# Patient Record
Sex: Male | Born: 1985 | Race: Black or African American | Hispanic: No | Smoking: Current every day smoker
Health system: Southern US, Community
[De-identification: ages and names within clinical notes are randomized; demographics above are authoritative.]

## PROBLEM LIST (undated history)

## (undated) HISTORY — PX: NO PAST SURGERIES: SHX2092

## (undated) HISTORY — PX: OTHER SURGICAL HISTORY: SHX169

---
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
## 2005-07-10 ENCOUNTER — Emergency Department (HOSPITAL_COMMUNITY): Admission: EM | Admit: 2005-07-10 | Discharge: 2005-07-10 | Payer: Self-pay | Admitting: Emergency Medicine

## 2007-09-27 ENCOUNTER — Emergency Department (HOSPITAL_COMMUNITY): Admission: EM | Admit: 2007-09-27 | Discharge: 2007-09-27 | Payer: Self-pay | Admitting: Emergency Medicine

## 2009-04-07 ENCOUNTER — Inpatient Hospital Stay (HOSPITAL_COMMUNITY): Admission: EM | Admit: 2009-04-07 | Discharge: 2009-04-09 | Payer: Self-pay | Admitting: Emergency Medicine

## 2010-06-21 LAB — POCT I-STAT, CHEM 8
BUN: 10 mg/dL (ref 6–23)
Chloride: 104 mEq/L (ref 96–112)
HCT: 49 % (ref 39.0–52.0)
Potassium: 3.8 mEq/L (ref 3.5–5.1)
TCO2: 28 mmol/L (ref 0–100)

## 2010-06-21 LAB — BASIC METABOLIC PANEL
Calcium: 8.8 mg/dL (ref 8.4–10.5)
GFR calc Af Amer: >60 mL/min (ref >60)
GFR calc non Af Amer: >60 mL/min (ref >60)
Potassium: 3.7 mEq/L (ref 3.5–5.1)
Sodium: 137 mEq/L (ref 135–145)

## 2010-06-21 LAB — CBC
HCT: 43.7 % (ref 39.0–52.0)
MCHC: 34.2 g/dL (ref 30.0–36.0)
RBC: 4.65 MIL/uL (ref 4.22–5.81)
WBC: 11 10*3/uL — ABNORMAL HIGH (ref 4.0–10.5)
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
## 2013-06-28 ENCOUNTER — Encounter (HOSPITAL_COMMUNITY): Payer: Self-pay | Admitting: Emergency Medicine

## 2013-06-28 ENCOUNTER — Emergency Department (HOSPITAL_COMMUNITY)
Admission: EM | Admit: 2013-06-28 | Discharge: 2013-06-28 | Disposition: A | Discharge status: Home / Self Care | Payer: Self-pay | Attending: Emergency Medicine | Admitting: Emergency Medicine

## 2013-06-28 DIAGNOSIS — M62838 Other muscle spasm: Secondary | ICD-10-CM | POA: Insufficient documentation

## 2013-06-28 DIAGNOSIS — F17200 Nicotine dependence, unspecified, uncomplicated: Secondary | ICD-10-CM | POA: Insufficient documentation

## 2013-06-28 MED ORDER — CYCLOBENZAPRINE HCL 10 MG PO TABS: 10.0000 mg | ORAL_TABLET | Freq: Two times a day (BID) | ORAL | Status: DC | PRN

## 2013-06-28 MED ORDER — IBUPROFEN 400 MG PO TABS
600.0000 mg | ORAL_TABLET | Freq: Once | ORAL | Status: AC
  Administered 2013-06-28: 600 mg via ORAL
  Filled 2013-06-28 (×2): qty 1

## 2013-06-28 MED ORDER — IBUPROFEN 600 MG PO TABS: 600.0000 mg | ORAL_TABLET | Freq: Four times a day (QID) | ORAL | Status: DC | PRN

## 2013-06-28 MED ORDER — TRAMADOL HCL 50 MG PO TABS: 50.0000 mg | ORAL_TABLET | Freq: Four times a day (QID) | ORAL | Status: DC | PRN

## 2013-06-28 MED ORDER — DIAZEPAM 5 MG PO TABS
5.0000 mg | ORAL_TABLET | Freq: Once | ORAL | Status: AC
  Administered 2013-06-28: 5 mg via ORAL
  Filled 2013-06-28: qty 1

## 2013-06-28 MED ORDER — HYDROCODONE-ACETAMINOPHEN 5-325 MG PO TABS
2.0000 | ORAL_TABLET | Freq: Once | ORAL | Status: AC
  Administered 2013-06-28: 2 via ORAL
  Filled 2013-06-28: qty 2

## 2013-06-28 NOTE — ED Notes (Signed)
PA at bedside.

## 2013-06-28 NOTE — ED Notes (Signed)
Pt reporting neck and back pain since Monday; tried icy/hot. Cannot touch chin to chest due to pain. Denies fever and chills.

## 2013-06-28 NOTE — Discharge Planning (Signed)
[facility] Felicia E, [employer]  Spoke to patient about primary care resources and establishing care with a provider. Patient states he is employed but  his employer doesn't offer health insurance. Resource guide and [employer] Market place information was provided. Patient was also provided with my contact information for any future questions or concerns.

## 2013-06-28 NOTE — ED Provider Notes (Signed)
CSN: [id number]     Arrival date & time 06/28/13  0940 History   None   This chart was scribed for Junius Finner PA-C, a non-physician practitioner working with No att. providers found by Lewanda Rife, ED Scribe. This patient was seen in room TR07C/TR07C and the patient's care was started at 5:09 PM      Chief Complaint  Patient presents with  . Back Pain     (Consider location/radiation/quality/duration/timing/severity/associated sxs/prior Treatment) The history is provided by the patient. No language interpreter was used.   HPI Comments: Shannon Stewart is a 28 y.o. male who presents to the Emergency Department complaining of constant worsening neck stiffness and pain onset 4 days. Describes pain as 7/10 in severity and "uncomfortable". Reports trying Icy hot, and tylenol with no relief of symptoms. Reports pain is exacerbated with movement to the left. Denies associated headache, nausea, emesis, change in vision, recent travel, sick contacts, rash, back pain, and recent trauma/injury. Denies significant PMHx.   History reviewed. No pertinent past medical history. History reviewed. No pertinent past surgical history. History reviewed. No pertinent family history. History  Substance Use Topics  . Smoking status: Current Every Day Smoker -- 0.50 packs/day  . Smokeless tobacco: Not on file  . Alcohol Use: Not on file    Review of Systems  Constitutional: Negative for fever.  Eyes: Negative for visual disturbance.  Gastrointestinal: Negative for nausea and vomiting.  Musculoskeletal: Positive for neck pain and neck stiffness. Negative for back pain.  Psychiatric/Behavioral: Negative for confusion.      Allergies  Review of patient's allergies indicates no known allergies.  Home Medications   Current Outpatient Rx  Name  Route  Sig  Dispense  Refill  . cyclobenzaprine (FLEXERIL) 10 MG tablet   Oral   Take 1 tablet (10 mg total) by mouth 2 (two) times daily as needed  for muscle spasms.   20 tablet   0   . ibuprofen (ADVIL,MOTRIN) 600 MG tablet   Oral   Take 1 tablet (600 mg total) by mouth every 6 (six) hours as needed.   30 tablet   0   . traMADol (ULTRAM) 50 MG tablet   Oral   Take 1 tablet (50 mg total) by mouth every 6 (six) hours as needed.   15 tablet   0    BP 113/67  Pulse 58  Temp(Src) 98.4 F (36.9 C) (Oral)  Resp 18  Ht 5\' 11"  (1.803 m)  Wt 165 lb (74.844 kg)  BMI 23.02 kg/m2  SpO2 100% Physical Exam  Nursing note and vitals reviewed. Constitutional: He is oriented to person, place, and time. He appears well-developed and well-nourished. No distress.  HENT:  Head: Normocephalic and atraumatic.  Eyes: EOM are normal.  Neck: Neck supple. Muscular tenderness present. No spinous process tenderness present. No tracheal deviation present.    Muscle spasm noted to bilateral cervical paraspinal muscles   Cardiovascular: Normal rate.   Pulmonary/Chest: Effort normal. No respiratory distress.  Musculoskeletal: Normal range of motion.  No midline C-spine, T-spine, or L-spine tenderness with no step-offs or deformities noted    Neurological: He is alert and oriented to person, place, and time.  Skin: Skin is warm and dry.  Psychiatric: He has a normal mood and affect. His behavior is normal.    ED Course  Procedures  COORDINATION OF CARE:  Nursing notes reviewed. Vital signs reviewed. Initial pt interview and examination performed.   5:09 PM-Discussed treatment with pt  at bedside. Pt agrees with plan.   Treatment plan initiated: Medications  diazepam (VALIUM) tablet 5 mg (5 mg Oral Given 06/28/13 1002)  ibuprofen (ADVIL,MOTRIN) tablet 600 mg (600 mg Oral Given 06/28/13 1002)  HYDROcodone-acetaminophen (NORCO/VICODIN) 5-325 MG per tablet 2 tablet (2 tablets Oral Given 06/28/13 1113)     Initial diagnostic testing ordered.    Labs Review Labs Reviewed - No data to display Imaging Review No results found.   EKG  Interpretation None      MDM   Final diagnoses:  Muscle spasms of neck    Pt presenting with neck pain and stiffness that started 3 days ago after waking up. Denies trauma but does report playing basketball day before. Denies recent illness including fever, n/v/d. Denies headache.  On exam pt appears well, non-toxic, NAD. Vitals-unremarkable, afebrile. Pt is tender along cervical musculature. Doubt meningitis.  Will tx pain in ED. Heating pack, valium and Ibuprofen given.  10:56 AM Pt has improved some, mild increased ROM in neck since given heating pack, valium and ibuprofen.  Will give Norco prior to discharge. Will discharge home and have f/u with PCP for continued pain.  Discussed continued heat therapy and gentle stretches.  Return precautions provided. Pt verbalized understanding and agreement with tx plan.  I personally performed the services described in this documentation, which was scribed in my presence. The recorded information has been reviewed and is accurate.    Junius Finnerrin O'Malley, PA-C 06/28/13 1712

## 2013-06-28 NOTE — ED Notes (Signed)
Nuchal stiffness; lacks full ROM.

## 2013-06-28 NOTE — Discharge Instructions (Signed)
Muscle Cramps and Spasms °Muscle cramps and spasms occur when a muscle or muscles tighten and you have no control over this tightening (involuntary muscle contraction). They are a common problem and can develop in any muscle. The most common place is in the calf muscles of the leg. Both muscle cramps and muscle spasms are involuntary muscle contractions, but they also have differences:  °· Muscle cramps are sporadic and painful. They may last a few seconds to a quarter of an hour. Muscle cramps are often more forceful and last longer than muscle spasms. °· Muscle spasms may or may not be painful. They may also last just a few seconds or much longer. °CAUSES  °It is uncommon for cramps or spasms to be due to a serious underlying problem. In many cases, the cause of cramps or spasms is unknown. Some common causes are:  °· Overexertion.   °· Overuse from repetitive motions (doing the same thing over and over).   °· Remaining in a certain position for a long period of time.   °· Improper preparation, form, or technique while performing a sport or activity.   °· Dehydration.   °· Injury.   °· Side effects of some medicines.   °· Abnormally low levels of the salts and ions in your blood (electrolytes), especially potassium and calcium. This could happen if you are taking water pills (diuretics) or you are pregnant.   °Some underlying medical problems can make it more likely to develop cramps or spasms. These include, but are not limited to:  °· Diabetes.   °· Parkinson disease.   °· Hormone disorders, such as thyroid problems.   °· Alcohol abuse.   °· Diseases specific to muscles, joints, and bones.   °· Blood vessel disease where not enough blood is getting to the muscles.   °HOME CARE INSTRUCTIONS  °· Stay well hydrated. Drink enough water and fluids to keep your urine clear or pale yellow. °· It may be helpful to massage, stretch, and relax the affected muscle. °· For tight or tense muscles, use a warm towel, heating  pad, or hot shower water directed to the affected area. °· If you are sore or have pain after a cramp or spasm, applying ice to the affected area may relieve discomfort. °· Put ice in a plastic bag. °· Place a towel between your skin and the bag. °· Leave the ice on for 15-20 minutes, 03-04 times a day. °· Medicines used to treat a known cause of cramps or spasms may help reduce their frequency or severity. Only take over-the-counter or prescription medicines as directed by your caregiver. °SEEK MEDICAL CARE IF:  °Your cramps or spasms get more severe, more frequent, or do not improve over time.  °MAKE SURE YOU:  °· Understand these instructions. °· Will watch your condition. °· Will get help right away if you are not doing well or get worse. °Document Released: 09/11/2001 Document Revised: 07/17/2012 Document Reviewed: 03/08/2012 °ExitCare® Patient Information ©2014 [employer]. ° °Heat Therapy °Heat therapy can help make painful, stiff muscles and joints feel better. Do not use heat on new injuries. Wait at least 48 hours after an injury to use heat. Do not use heat when you have aches or pains right after an activity. If you still have pain 3 hours after stopping the activity, then you may use heat. °HOME CARE °Wet heat pack °· Soak a clean towel in warm water. Squeeze out the extra water. °· Put the warm, wet towel in a plastic bag. °· Place a thin, dry towel between your   skin and the bag. °· Put the heat pack on the area for 5 minutes, and check your skin. Your skin may be pink, but it should not be red. °· Leave the heat pack on the area for 15 to 30 minutes. °· Repeat this every 2 to 4 hours while awake. Do not use heat while you are sleeping. °Warm water bath °· Fill a tub with warm water. °· Place the affected body part in the tub. °· Soak the area for 20 to 40 minutes. °· Repeat as needed. °Hot water bottle °· Fill the water bottle half full with hot water. °· Press out the extra air. Close the cap  tightly. °· Place a dry towel between your skin and the bottle. °· Put the bottle on the area for 5 minutes, and check your skin. Your skin may be pink, but it should not be red. °· Leave the bottle on the area for 15 to 30 minutes. °· Repeat this every 2 to 4 hours while awake. °Electric heating pad °· Place a dry towel between your skin and the heating pad. °· Set the heating pad on low heat. °· Put the heating pad on the area for 10 minutes, and check your skin. Your skin may be pink, but it should not be red. °· Leave the heating pad on the area for 20 to 40 minutes. °· Repeat this every 2 to 4 hours while awake. °· Do not lie on the heating pad. °· Do not fall asleep while using the heating pad. °· Do not use the heating pad near water. °GET HELP RIGHT AWAY IF: °· You get blisters or red skin. °· Your skin is puffy (swollen), or you lose feeling (numbness) in the affected area. °· You have any new problems. °· Your problems are getting worse. °· You have any questions or concerns. °If you have any problems, stop using heat therapy until you see your doctor. °MAKE SURE YOU: °· Understand these instructions. °· Will watch your condition. °· Will get help right away if you are not doing well or get worse. °Document Released: 06/14/2011 Document Reviewed: 06/14/2011 °ExitCare® Patient Information ©2014 [employer]. ° °

## 2013-07-01 NOTE — ED Provider Notes (Signed)
Medical screening examination/treatment/procedure(s) were performed by non-physician practitioner and as supervising physician I was immediately available for consultation/collaboration.   EKG Interpretation None       Donnetta HutchingBrian Kristalyn Bergstresser, MD 07/01/13 1531

## 2014-12-04 ENCOUNTER — Emergency Department (HOSPITAL_COMMUNITY)
Admission: EM | Admit: 2014-12-04 | Discharge: 2014-12-04 | Disposition: A | Discharge status: Home / Self Care | Payer: Self-pay | Attending: Emergency Medicine | Admitting: Emergency Medicine

## 2014-12-04 ENCOUNTER — Encounter (HOSPITAL_COMMUNITY): Payer: Self-pay | Admitting: *Deleted

## 2014-12-04 DIAGNOSIS — L03115 Cellulitis of right lower limb: Secondary | ICD-10-CM | POA: Insufficient documentation

## 2014-12-04 DIAGNOSIS — Z720 Tobacco use: Secondary | ICD-10-CM | POA: Insufficient documentation

## 2014-12-04 LAB — URINALYSIS, ROUTINE W REFLEX MICROSCOPIC
GLUCOSE, UA: NEGATIVE mg/dL
KETONES UR: NEGATIVE mg/dL
Leukocytes, UA: NEGATIVE
Nitrite: NEGATIVE
PH: 5.5 (ref 5.0–8.0)
Protein, ur: NEGATIVE mg/dL
SPECIFIC GRAVITY, URINE: 1.03 (ref 1.005–1.030)
Urobilinogen, UA: 0.2 mg/dL (ref 0.0–1.0)

## 2014-12-04 LAB — CBC WITH DIFFERENTIAL/PLATELET
BASOS ABS: 0.1 10*3/uL (ref 0.0–0.1)
BASOS PCT: 1 % (ref 0–1)
EOS PCT: 2 % (ref 0–5)
Eosinophils Absolute: 0.2 10*3/uL (ref 0.0–0.7)
HEMATOCRIT: 44.3 % (ref 39.0–52.0)
Hemoglobin: 15 g/dL (ref 13.0–17.0)
LYMPHS PCT: 20 % (ref 12–46)
Lymphs Abs: 2 10*3/uL (ref 0.7–4.0)
MCH: 32.7 pg (ref 26.0–34.0)
MCHC: 33.9 g/dL (ref 30.0–36.0)
MCV: 96.5 fL (ref 78.0–100.0)
MONO ABS: 0.9 10*3/uL (ref 0.1–1.0)
MONOS PCT: 10 % (ref 3–12)
NEUTROS ABS: 6.8 10*3/uL (ref 1.7–7.7)
Neutrophils Relative %: 69 % (ref 43–77)
PLATELETS: 323 10*3/uL (ref 150–400)
RBC: 4.59 MIL/uL (ref 4.22–5.81)
RDW: 13.1 % (ref 11.5–15.5)
WBC: 9.9 10*3/uL (ref 4.0–10.5)

## 2014-12-04 LAB — URINE MICROSCOPIC-ADD ON

## 2014-12-04 LAB — BASIC METABOLIC PANEL
ANION GAP: 7 (ref 5–15)
BUN: 7 mg/dL (ref 6–20)
CALCIUM: 9.2 mg/dL (ref 8.9–10.3)
CO2: 28 mmol/L (ref 22–32)
CREATININE: 1.11 mg/dL (ref 0.61–1.24)
Chloride: 103 mmol/L (ref 101–111)
GFR calc Af Amer: >60 mL/min (ref >60)
GLUCOSE: 94 mg/dL (ref 65–99)
Potassium: 3.8 mmol/L (ref 3.5–5.1)
Sodium: 138 mmol/L (ref 135–145)

## 2014-12-04 MED ORDER — OXYCODONE-ACETAMINOPHEN 5-325 MG PO TABS
1.0000 | ORAL_TABLET | Freq: Once | ORAL | Status: AC
  Administered 2014-12-04: 1 via ORAL

## 2014-12-04 MED ORDER — CLINDAMYCIN HCL 150 MG PO CAPS: 300.0000 mg | ORAL_CAPSULE | Freq: Three times a day (TID) | ORAL | Status: DC

## 2014-12-04 MED ORDER — ACIDOPHILUS PROBIOTIC 10 MG PO TABS: 10.0000 mg | ORAL_TABLET | Freq: Three times a day (TID) | ORAL | Status: DC

## 2014-12-04 MED ORDER — NAPROXEN 250 MG PO TABS: 250.0000 mg | ORAL_TABLET | Freq: Two times a day (BID) | ORAL | Status: DC

## 2014-12-04 MED ORDER — CLINDAMYCIN HCL 150 MG PO CAPS
300.0000 mg | ORAL_CAPSULE | Freq: Once | ORAL | Status: AC
  Administered 2014-12-04: 300 mg via ORAL
  Filled 2014-12-04: qty 2

## 2014-12-04 MED ORDER — OXYCODONE-ACETAMINOPHEN 5-325 MG PO TABS
ORAL_TABLET | ORAL | Status: AC
  Filled 2014-12-04: qty 1

## 2014-12-04 NOTE — ED Notes (Signed)
Patient presents with right lower leg swelling  Noticed an area to the bottom of his knee and now swelling 3/4 way down his calf   ?spider bite

## 2014-12-04 NOTE — Discharge Instructions (Signed)

## 2014-12-04 NOTE — ED Provider Notes (Signed)
CSN: [id number]     Arrival date & time 12/04/14  1922 History  This chart was scribed for Everlene Farrier, PA-C, working with Gerhard Munch, MD by Octavia Heir, ED Scribe. This patient was seen in room TR10C/TR10C and the patient's care was started at 8:08 PM.    Chief Complaint  Patient presents with  . Leg Pain      The history is provided by the patient. No language interpreter was used.   HPI Comments: Shannon Stewart is a 29 y.o. male who presents to the Emergency Department complaining of constant, gradual worsening right leg pain onset 3 days ago. He states having associated nausea today. Pt reports waking up to a bump on his right knee with some pain when with movement. He has had progressive swelling down his right calf and states there is pain when he bends his knee.  Pt denies fevers, chills, muscle spasms, leg cramping, neck pain, neck stiffness, or rash.  History reviewed. No pertinent past medical history. Past Surgical History  Procedure Laterality Date  . Other surgical history     No family history on file. Social History  Substance Use Topics  . Smoking status: Current Every Day Smoker -- 0.50 packs/day  . Smokeless tobacco: Never Used  . Alcohol Use: Yes    Review of Systems  Constitutional: Negative for fever and chills.  Eyes: Negative for visual disturbance.  Genitourinary: Negative for dysuria, urgency, frequency, hematuria and difficulty urinating.  Musculoskeletal: Positive for joint swelling and arthralgias. Negative for back pain, neck pain and neck stiffness.  Skin: Positive for color change. Negative for rash and wound.  Neurological: Negative for dizziness, weakness, light-headedness, numbness and headaches.      Allergies  Review of patient's allergies indicates no known allergies.  Home Medications   Prior to Admission medications   Medication Sig Start Date End Date Taking? Authorizing Provider  clindamycin (CLEOCIN) 150 MG capsule  Take 2 capsules (300 mg total) by mouth 3 (three) times daily. May dispense as 150mg  capsules 12/04/14   Everlene Farrier, PA-C  cyclobenzaprine (FLEXERIL) 10 MG tablet Take 1 tablet (10 mg total) by mouth 2 (two) times daily as needed for muscle spasms. 06/28/13   Junius Finner, PA-C  ibuprofen (ADVIL,MOTRIN) 600 MG tablet Take 1 tablet (600 mg total) by mouth every 6 (six) hours as needed. 06/28/13   Junius Finner, PA-C  Lactobacillus (ACIDOPHILUS PROBIOTIC) 10 MG TABS Take 10 mg by mouth 3 (three) times daily. 12/04/14   Everlene Farrier, PA-C  naproxen (NAPROSYN) 250 MG tablet Take 1 tablet (250 mg total) by mouth 2 (two) times daily with a meal. 12/04/14   Everlene Farrier, PA-C  traMADol (ULTRAM) 50 MG tablet Take 1 tablet (50 mg total) by mouth every 6 (six) hours as needed. 06/28/13   Junius Finner, PA-C   Triage vitals: BP 110/68 mmHg  Pulse 83  Temp(Src) 99.4 F (37.4 C) (Oral)  Resp 16  Ht 6' (1.829 m)  Wt 160 lb 9.6 oz (72.848 kg)  BMI 21.78 kg/m2  SpO2 98%  Physical Exam  Constitutional: He appears well-developed and well-nourished. No distress.  Nontoxic appearing.  HENT:  Head: Normocephalic and atraumatic.  Eyes: Right eye exhibits no discharge. Left eye exhibits no discharge.  Cardiovascular: Normal rate, regular rhythm and intact distal pulses.   Bilateral dorsalis pedis and posterior tibialis pulses are intact.  Pulmonary/Chest: Effort normal. No respiratory distress.  Abdominal: Soft. There is no tenderness.  Musculoskeletal: Normal range of  motion. He exhibits edema and tenderness.  tenderness to the anterior aspect of right knee with edema, some edema to right calf. Good ROM of right knee. Knee feels warm to touch and there is an area of erythema to the anterior aspect of his knee and leg that is marked with a skin marker. Bilateral DP/PT pulses intact. No pedal or ankle edema. No evidence of abscess on soft tissue ultrasound.    Neurological: He is alert. Coordination normal.   Sensation is intact to his bilateral lower extremities.   Skin: Skin is warm and dry. No rash noted. He is not diaphoretic. There is erythema. No pallor.  See MSK.   Psychiatric: He has a normal mood and affect. His behavior is normal.  Nursing note and vitals reviewed.   ED Course  Procedures  DIAGNOSTIC STUDIES: Oxygen Saturation is 98% on RA, normal by my interpretation.  COORDINATION OF CARE:  8:14 PM Discussed treatment plan with pt at bedside and pt agreed to plan.  Labs Review Labs Reviewed  URINALYSIS, ROUTINE W REFLEX MICROSCOPIC (NOT AT [facility]) - Abnormal; Notable for the following:    Hgb urine dipstick TRACE (*)    Bilirubin Urine SMALL (*)    All other components within normal limits  CBC WITH DIFFERENTIAL/PLATELET  BASIC METABOLIC PANEL  URINE MICROSCOPIC-ADD ON    Imaging Review No results found.  EMERGENCY DEPARTMENT US SOFT TISSUE INTERPRETATION "Study: Limited Ultrasound of the noted body part in comments below"  INDICATIONS: Soft tissue infection Multiple views of the body part are obtained with a multi-frequency linear probe  PERFORMED BY:  Myself  IMAGES ARCHIVED?: Yes  SIDE:Right   BODY PART:Lower extremity  FINDINGS: Cellulitis present, No abscess noted.   LIMITATIONS:  N/A  INTERPRETATION:  Cellulitis present  COMMENT:    Cellulitis without abscess.    EKG Interpretation None      Filed Vitals:   12/04/14 1941 12/04/14 2103 12/04/14 2104  BP: 110/68 119/67   Pulse: 83 67   Temp: 99.4 F (37.4 C)  99.2 F (37.3 C)  TempSrc: Oral  Axillary  Resp: 16 16   Height: 6' (1.829 m)    Weight: 160 lb 9.6 oz (72.848 kg)    SpO2: 98% 99%      MDM   Meds given in ED:  Medications  oxyCODONE-acetaminophen (PERCOCET/ROXICET) 5-325 MG per tablet (not administered)  oxyCODONE-acetaminophen (PERCOCET/ROXICET) 5-325 MG per tablet 1 tablet (1 tablet Oral Given 12/04/14 1954)  clindamycin (CLEOCIN) capsule 300 mg (300 mg Oral Given  12/04/14 2101)    Discharge Medication List as of 12/04/2014  8:55 PM    START taking these medications   Details  clindamycin (CLEOCIN) 150 MG capsule Take 2 capsules (300 mg total) by mouth 3 (three) times daily. May dispense as 150mg  capsules, Starting 12/04/2014, Until Discontinued, Print    Lactobacillus (ACIDOPHILUS PROBIOTIC) 10 MG TABS Take 10 mg by mouth 3 (three) times daily., Starting 12/04/2014, Until Discontinued, Print    naproxen (NAPROSYN) 250 MG tablet Take 1 tablet (250 mg total) by mouth 2 (two) times daily with a meal., Starting 12/04/2014, Until Discontinued, Print        Final diagnoses:  Cellulitis of right lower extremity    This is a 29 year old male who preent to the emergency department cmplaining of  Pain and swelling to his right lower extremity. He reports it first started with a bump to his right anterior knee  And progressed to pain and swelling down his  lower leg. On exam the patient is afebrile nontoxic appearing. The patient has tenderness to the anterior aspect of his knee. He has good range of motion of his right knee and is not concerning for septic joint. There is erythema to the anterior aspect of his knee and anterior right leg that has been marked with skin marker. His right leg feels warmer than his left over the erythema. Soft tissue ultrasound indicated no evidence of abscess. Patient's exam is consistent with cellulitis. We'll discharge patient with prescriptions for clindamycin and naproxen for pain control. I advised him he needs to follow-up in 48 hours to have his cellulitis rechecked. I advised the patient to follow-up with their primary care provider this week. I advised the patient to return to the emergency department with new or worsening symptoms or new concerns. The patient verbalized understanding and agreement with plan.    This patient was discussed with Dr. Jeraldine Loots who agrees with assessment and plan.  I personally performed the services  described in this documentation, which was scribed in my presence. The recorded information has been reviewed and is accurate.    Everlene Farrier, PA-C 12/04/14 2147  Gerhard Munch, MD 12/04/14 [phone]

## 2014-12-07 ENCOUNTER — Encounter (HOSPITAL_COMMUNITY): Payer: Self-pay | Admitting: Nurse Practitioner

## 2014-12-07 ENCOUNTER — Emergency Department (HOSPITAL_COMMUNITY)
Admission: EM | Admit: 2014-12-07 | Discharge: 2014-12-07 | Disposition: A | Discharge status: Home / Self Care | Payer: Self-pay | Attending: Emergency Medicine | Admitting: Emergency Medicine

## 2014-12-07 DIAGNOSIS — Z720 Tobacco use: Secondary | ICD-10-CM | POA: Insufficient documentation

## 2014-12-07 DIAGNOSIS — L0291 Cutaneous abscess, unspecified: Secondary | ICD-10-CM

## 2014-12-07 DIAGNOSIS — L02415 Cutaneous abscess of right lower limb: Secondary | ICD-10-CM | POA: Insufficient documentation

## 2014-12-07 DIAGNOSIS — L03115 Cellulitis of right lower limb: Secondary | ICD-10-CM | POA: Insufficient documentation

## 2014-12-07 MED ORDER — LIDOCAINE HCL (PF) 1 % IJ SOLN
5.0000 mL | Freq: Once | INTRAMUSCULAR | Status: AC
  Administered 2014-12-07: 5 mL

## 2014-12-07 MED ORDER — LIDOCAINE-EPINEPHRINE 2 %-1:200000 IJ SOLN
20.0000 mL | Freq: Once | INTRAMUSCULAR | Status: AC
Start: 2014-12-07 — End: 2014-12-07
  Administered 2014-12-07: 20 mL
  Filled 2014-12-07: qty 20

## 2014-12-07 MED ORDER — VANCOMYCIN HCL 10 G IV SOLR
1250.0000 mg | Freq: Once | INTRAVENOUS | Status: AC
  Administered 2014-12-07: 1250 mg via INTRAVENOUS
  Filled 2014-12-07: qty 1250

## 2014-12-07 MED ORDER — LIDOCAINE HCL (PF) 1 % IJ SOLN
INTRAMUSCULAR | Status: AC
  Administered 2014-12-07: 5 mL
  Filled 2014-12-07: qty 5

## 2014-12-07 MED ORDER — HYDROCODONE-ACETAMINOPHEN 5-325 MG PO TABS
2.0000 | ORAL_TABLET | Freq: Once | ORAL | Status: AC
  Administered 2014-12-07: 2 via ORAL
  Filled 2014-12-07: qty 2

## 2014-12-07 NOTE — ED Provider Notes (Addendum)
CSN: [id number]     Arrival date & time 12/07/14  1238 History   First MD Initiated Contact with Patient 12/07/14 1313     Chief Complaint  Patient presents with  . Skin Problem      HPI  Patient presents for evaluation of the lower leg infection.  Seen here several days ago and placed on clindamycin. Cellulitis. Head area became worsening and more painful with proximal aspect of the cellulitis. States he "squeezed it is was draining blood and pus". Presents here. No fever shakes chills rigors.  History reviewed. No pertinent past medical history. Past Surgical History  Procedure Laterality Date  . Other surgical history     History reviewed. No pertinent family history. Social History  Substance Use Topics  . Smoking status: Current Every Day Smoker -- 0.50 packs/day  . Smokeless tobacco: Never Used  . Alcohol Use: Yes    Review of Systems  Constitutional: Negative for fever, chills, diaphoresis, appetite change and fatigue.  HENT: Negative for mouth sores, sore throat and trouble swallowing.   Eyes: Negative for visual disturbance.  Respiratory: Negative for cough, chest tightness, shortness of breath and wheezing.   Cardiovascular: Negative for chest pain.  Gastrointestinal: Negative for nausea, vomiting, abdominal pain, diarrhea and abdominal distention.  Endocrine: Negative for polydipsia, polyphagia and polyuria.  Genitourinary: Negative for dysuria, frequency and hematuria.  Musculoskeletal: Negative for gait problem.  Skin: Negative for color change, pallor and rash.       Cellulitis and abscess right lower leg.  Neurological: Negative for dizziness, syncope, light-headedness and headaches.  Hematological: Does not bruise/bleed easily.  Psychiatric/Behavioral: Negative for behavioral problems and confusion.      Allergies  Review of patient's allergies indicates no known allergies.  Home Medications   Prior to Admission medications   Medication Sig Start  Date End Date Taking? Authorizing Provider  clindamycin (CLEOCIN) 150 MG capsule Take 2 capsules (300 mg total) by mouth 3 (three) times daily. May dispense as 150mg  capsules 12/04/14  Yes Everlene Farrier, PA-C  ibuprofen (ADVIL,MOTRIN) 200 MG tablet Take 200 mg by mouth every 6 (six) hours as needed for mild pain or moderate pain.   Yes Historical Provider, MD  Lactobacillus (ACIDOPHILUS PROBIOTIC) 10 MG TABS Take 10 mg by mouth 3 (three) times daily. Patient not taking: Reported on 12/07/2014 12/04/14   Everlene Farrier, PA-C  naproxen (NAPROSYN) 250 MG tablet Take 1 tablet (250 mg total) by mouth 2 (two) times daily with a meal. Patient not taking: Reported on 12/07/2014 12/04/14   Everlene Farrier, PA-C   BP 117/46 mmHg  Pulse 89  Temp(Src) 98.9 F (37.2 C) (Oral)  Resp 17  SpO2 96% Physical Exam  Constitutional: He is oriented to person, place, and time. He appears well-developed and well-nourished. No distress.  HENT:  Head: Normocephalic.  Eyes: Conjunctivae are normal. Pupils are equal, round, and reactive to light. No scleral icterus.  Neck: Normal range of motion. Neck supple. No thyromegaly present.  Cardiovascular: Normal rate and regular rhythm.  Exam reveals no gallop and no friction rub.   No murmur heard. Pulmonary/Chest: Effort normal and breath sounds normal. No respiratory distress. He has no wheezes. He has no rales.  Abdominal: Soft. Bowel sounds are normal. He exhibits no distension. There is no tenderness. There is no rebound.  Musculoskeletal: Normal range of motion.       Legs: Neurological: He is alert and oriented to person, place, and time.  Skin: Skin is warm and  dry. No rash noted.  Psychiatric: He has a normal mood and affect. His behavior is normal.    ED Course  Procedures (including critical care time) Labs Review Labs Reviewed - No data to display  Imaging Review No results found. I have personally reviewed and evaluated these images and lab results as  part of my medical decision-making.   EKG Interpretation None      MDM   Final diagnoses:  Abscess  Cellulitis of right lower extremity   After incision and drainage patient given IV vancomycin. Given sections on care of the abscess and packing removal. Recheck here if any failure to improve or worsening.   INCISION AND DRAINAGE Performed by: Claudean Kinds Consent: Verbal consent obtained. Risks and benefits: risks, benefits and alternatives were discussed Type: abscess  Body area: right knee  Anesthesia: local infiltration  Incision was made with a scalpel.  Local anesthetic: lidocaine 2% c epinephrine  Anesthetic total: 4 ml  Complexity: complex Blunt dissection to break up loculations  Drainage: purulent  Drainage amount: small  Packing material: 1/4 in iodoform gauze  Patient tolerance: Patient tolerated the procedure well with no immediate complications.   and     Rolland Porter, MD 12/08/14 8119  Rolland Porter, MD 12/20/14 [phone]

## 2014-12-07 NOTE — Discharge Instructions (Signed)
In 48 hours, soak and remove the gauze from the wound. Continue warm soaks 3 times per day with gentle massage. Return here with any worsening.  Abscess An abscess is an infected area that contains a collection of pus and debris.It can occur in almost any part of the body. An abscess is also known as a furuncle or boil. CAUSES  An abscess occurs when tissue gets infected. This can occur from blockage of oil or sweat glands, infection of hair follicles, or a minor injury to the skin. As the body tries to fight the infection, pus collects in the area and creates pressure under the skin. This pressure causes pain. People with weakened immune systems have difficulty fighting infections and get certain abscesses more often.  SYMPTOMS Usually an abscess develops on the skin and becomes a painful mass that is red, warm, and tender. If the abscess forms under the skin, you may feel a moveable soft area under the skin. Some abscesses break open (rupture) on their own, but most will continue to get worse without care. The infection can spread deeper into the body and eventually into the bloodstream, causing you to feel ill.  DIAGNOSIS  Your caregiver will take your medical history and perform a physical exam. A sample of fluid may also be taken from the abscess to determine what is causing your infection. TREATMENT  Your caregiver may prescribe antibiotic medicines to fight the infection. However, taking antibiotics alone usually does not cure an abscess. Your caregiver may need to make a small cut (incision) in the abscess to drain the pus. In some cases, gauze is packed into the abscess to reduce pain and to continue draining the area. HOME CARE INSTRUCTIONS   Only take over-the-counter or prescription medicines for pain, discomfort, or fever as directed by your caregiver.  If you were prescribed antibiotics, take them as directed. Finish them even if you start to feel better.  If gauze is used, follow  your caregiver's directions for changing the gauze.  To avoid spreading the infection:  Keep your draining abscess covered with a bandage.  Wash your hands well.  Do not share personal care items, towels, or whirlpools with others.  Avoid skin contact with others.  Keep your skin and clothes clean around the abscess.  Keep all follow-up appointments as directed by your caregiver. SEEK MEDICAL CARE IF:   You have increased pain, swelling, redness, fluid drainage, or bleeding.  You have muscle aches, chills, or a general ill feeling.  You have a fever. MAKE SURE YOU:   Understand these instructions.  Will watch your condition.  Will get help right away if you are not doing well or get worse. Document Released: 12/30/2004 Document Revised: 09/21/2011 Document Reviewed: 06/04/2011 [facility] Patient Information 2015 [place], [place]. This information is not intended to replace advice given to you by your health care provider. Make sure you discuss any questions you have with your health care provider.

## 2014-12-07 NOTE — ED Notes (Addendum)
He was seen here on 8/31 for cellulitis of RLE, he was started on oral abx which he has been taking and initially the symptoms seemed better but he woke today and the pain and swelling had increased and the redness is spreading outside  The skin marker

## 2017-08-08 ENCOUNTER — Other Ambulatory Visit: Payer: Self-pay | Admitting: Orthopedic Surgery

## 2017-08-08 ENCOUNTER — Other Ambulatory Visit: Payer: Self-pay

## 2017-08-08 ENCOUNTER — Ambulatory Visit (HOSPITAL_BASED_OUTPATIENT_CLINIC_OR_DEPARTMENT_OTHER): Payer: Worker's Compensation | Admitting: Anesthesiology

## 2017-08-08 ENCOUNTER — Encounter (HOSPITAL_BASED_OUTPATIENT_CLINIC_OR_DEPARTMENT_OTHER): Payer: Self-pay

## 2017-08-08 ENCOUNTER — Encounter (HOSPITAL_BASED_OUTPATIENT_CLINIC_OR_DEPARTMENT_OTHER)
Admission: RE | Disposition: A | Discharge status: Home / Self Care | Payer: Self-pay | Source: Ambulatory Visit | Attending: Orthopedic Surgery

## 2017-08-08 ENCOUNTER — Ambulatory Visit (HOSPITAL_BASED_OUTPATIENT_CLINIC_OR_DEPARTMENT_OTHER)
Admission: RE | Admit: 2017-08-08 | Discharge: 2017-08-08 | Disposition: A | Discharge status: Home / Self Care | Payer: Worker's Compensation | Source: Ambulatory Visit | Attending: Orthopedic Surgery | Admitting: Orthopedic Surgery

## 2017-08-08 DIAGNOSIS — S62521B Displaced fracture of distal phalanx of right thumb, initial encounter for open fracture: Secondary | ICD-10-CM | POA: Insufficient documentation

## 2017-08-08 DIAGNOSIS — F17200 Nicotine dependence, unspecified, uncomplicated: Secondary | ICD-10-CM | POA: Insufficient documentation

## 2017-08-08 DIAGNOSIS — Y990 Civilian activity done for income or pay: Secondary | ICD-10-CM | POA: Insufficient documentation

## 2017-08-08 DIAGNOSIS — Y9269 Other specified industrial and construction area as the place of occurrence of the external cause: Secondary | ICD-10-CM | POA: Insufficient documentation

## 2017-08-08 DIAGNOSIS — W311XXA Contact with metalworking machines, initial encounter: Secondary | ICD-10-CM | POA: Insufficient documentation

## 2017-08-08 HISTORY — PX: INCISION AND DRAINAGE WOUND WITH NAILBED REPAIR: SHX5636

## 2017-08-08 SURGERY — INCISION AND DRAINAGE WOUND WITH NAILBED REPAIR
Anesthesia: Monitor Anesthesia Care | Site: Hand | Laterality: Right

## 2017-08-08 MED ORDER — BUPIVACAINE HCL (PF) 0.25 % IJ SOLN
INTRAMUSCULAR | Status: DC | PRN
  Administered 2017-08-08: 9 mL

## 2017-08-08 MED ORDER — FENTANYL CITRATE (PF) 100 MCG/2ML IJ SOLN: 25.0000 ug | INTRAMUSCULAR | Status: DC | PRN

## 2017-08-08 MED ORDER — SCOPOLAMINE 1 MG/3DAYS TD PT72: 1.0000 | MEDICATED_PATCH | Freq: Once | TRANSDERMAL | Status: DC

## 2017-08-08 MED ORDER — MIDAZOLAM HCL 2 MG/2ML IJ SOLN
INTRAMUSCULAR | Status: AC
  Filled 2017-08-08: qty 2

## 2017-08-08 MED ORDER — LACTATED RINGERS IV SOLN
INTRAVENOUS | Status: DC
  Administered 2017-08-08: 14:00:00 via INTRAVENOUS

## 2017-08-08 MED ORDER — BUPIVACAINE HCL (PF) 0.25 % IJ SOLN
INTRAMUSCULAR | Status: AC
  Filled 2017-08-08: qty 30

## 2017-08-08 MED ORDER — FENTANYL CITRATE (PF) 100 MCG/2ML IJ SOLN
50.0000 ug | INTRAMUSCULAR | Status: AC | PRN
  Administered 2017-08-08: 100 ug via INTRAVENOUS
  Administered 2017-08-08 (×2): 50 ug via INTRAVENOUS

## 2017-08-08 MED ORDER — CHLORHEXIDINE GLUCONATE 4 % EX LIQD: 60.0000 mL | Freq: Once | CUTANEOUS | Status: DC

## 2017-08-08 MED ORDER — PROPOFOL 500 MG/50ML IV EMUL
INTRAVENOUS | Status: DC | PRN
  Administered 2017-08-08: 200 ug/kg/min via INTRAVENOUS

## 2017-08-08 MED ORDER — FENTANYL CITRATE (PF) 100 MCG/2ML IJ SOLN
INTRAMUSCULAR | Status: AC
  Filled 2017-08-08: qty 2

## 2017-08-08 MED ORDER — PROMETHAZINE HCL 25 MG/ML IJ SOLN: 6.2500 mg | INTRAMUSCULAR | Status: DC | PRN

## 2017-08-08 MED ORDER — MIDAZOLAM HCL 2 MG/2ML IJ SOLN
1.0000 mg | INTRAMUSCULAR | Status: DC | PRN
  Administered 2017-08-08: 2 mg via INTRAVENOUS

## 2017-08-08 MED ORDER — MEPERIDINE HCL 25 MG/ML IJ SOLN: 6.2500 mg | INTRAMUSCULAR | Status: DC | PRN

## 2017-08-08 MED ORDER — SULFAMETHOXAZOLE-TRIMETHOPRIM 800-160 MG PO TABS: 1.0000 | ORAL_TABLET | Freq: Two times a day (BID) | ORAL | 0 refills | Status: DC

## 2017-08-08 MED ORDER — ONDANSETRON HCL 4 MG/2ML IJ SOLN
4.0000 mg | Freq: Once | INTRAMUSCULAR | Status: AC
  Administered 2017-08-08: 4 mg via INTRAVENOUS

## 2017-08-08 MED ORDER — HYDROCODONE-ACETAMINOPHEN 7.5-325 MG PO TABS
1.0000 | ORAL_TABLET | Freq: Once | ORAL | Status: DC | PRN
Start: 2017-08-08 — End: 2017-08-08

## 2017-08-08 MED ORDER — HYDROCODONE-ACETAMINOPHEN 5-325 MG PO TABS: ORAL_TABLET | ORAL | 0 refills | Status: DC

## 2017-08-08 MED ORDER — CEFAZOLIN SODIUM-DEXTROSE 2-4 GM/100ML-% IV SOLN
2.0000 g | INTRAVENOUS | Status: AC
  Administered 2017-08-08: 2 g via INTRAVENOUS

## 2017-08-08 SURGICAL SUPPLY — 45 items
BANDAGE ACE 3X5.8 VEL STRL LF (GAUZE/BANDAGES/DRESSINGS) ×4 IMPLANT
BANDAGE ACE 4X5 VEL STRL LF (GAUZE/BANDAGES/DRESSINGS) IMPLANT
BLADE SURG 15 STRL LF DISP TIS (BLADE) ×4 IMPLANT
BLADE SURG 15 STRL SS (BLADE) ×4
BNDG ELASTIC 2X5.8 VLCR STR LF (GAUZE/BANDAGES/DRESSINGS) IMPLANT
BNDG ESMARK 4X9 LF (GAUZE/BANDAGES/DRESSINGS) ×8 IMPLANT
BNDG GAUZE ELAST 4 BULKY (GAUZE/BANDAGES/DRESSINGS) ×4 IMPLANT
CHLORAPREP W/TINT 26ML (MISCELLANEOUS) ×4 IMPLANT
CORD BIPOLAR FORCEPS 12FT (ELECTRODE) IMPLANT
COVER BACK TABLE 60X90IN (DRAPES) ×4 IMPLANT
COVER MAYO STAND STRL (DRAPES) ×4 IMPLANT
CUFF TOURNIQUET SINGLE 18IN (TOURNIQUET CUFF) ×4 IMPLANT
DRAPE EXTREMITY T 121X128X90 (DRAPE) ×4 IMPLANT
DRAPE OEC MINIVIEW 54X84 (DRAPES) ×4 IMPLANT
DRAPE SURG 17X23 STRL (DRAPES) ×4 IMPLANT
GAUZE SPONGE 4X4 12PLY STRL (GAUZE/BANDAGES/DRESSINGS) ×4 IMPLANT
GAUZE SPONGE 4X4 16PLY XRAY LF (GAUZE/BANDAGES/DRESSINGS) IMPLANT
GAUZE XEROFORM 1X8 LF (GAUZE/BANDAGES/DRESSINGS) ×4 IMPLANT
GLOVE BIO SURGEON STRL SZ7.5 (GLOVE) ×4 IMPLANT
GLOVE BIOGEL PI IND STRL 8 (GLOVE) ×2 IMPLANT
GLOVE BIOGEL PI INDICATOR 8 (GLOVE) ×2
GOWN STRL REUS W/ TWL LRG LVL3 (GOWN DISPOSABLE) ×2 IMPLANT
GOWN STRL REUS W/ TWL XL LVL3 (GOWN DISPOSABLE) ×2 IMPLANT
GOWN STRL REUS W/TWL LRG LVL3 (GOWN DISPOSABLE) ×2
GOWN STRL REUS W/TWL XL LVL3 (GOWN DISPOSABLE) ×6 IMPLANT
NEEDLE HYPO 22GX1.5 SAFETY (NEEDLE) IMPLANT
NS IRRIG 1000ML POUR BTL (IV SOLUTION) ×4 IMPLANT
PACK BASIN DAY SURGERY FS (CUSTOM PROCEDURE TRAY) ×4 IMPLANT
PAD CAST 3X4 CTTN HI CHSV (CAST SUPPLIES) ×2 IMPLANT
PAD CAST 4YDX4 CTTN HI CHSV (CAST SUPPLIES) ×2 IMPLANT
PADDING CAST COTTON 3X4 STRL (CAST SUPPLIES) ×2
PADDING CAST COTTON 4X4 STRL (CAST SUPPLIES) ×2
SPLINT FINGER 3.25 BULB 911905 (SOFTGOODS) ×4 IMPLANT
SPLINT PLASTER CAST XFAST 3X15 (CAST SUPPLIES) IMPLANT
SPLINT PLASTER XTRA FASTSET 3X (CAST SUPPLIES)
STOCKINETTE 4X48 STRL (DRAPES) ×4 IMPLANT
SUT ETHILON 3 0 PS 1 (SUTURE) IMPLANT
SUT ETHILON 4 0 PS 2 18 (SUTURE) ×4 IMPLANT
SUT ETHILON 5 0 P 3 18 (SUTURE) ×2
SUT ETHILON 6 0 P 1 (SUTURE) IMPLANT
SUT MON AB 5-0 P3 18 (SUTURE) ×4 IMPLANT
SUT NYLON ETHILON 5-0 P-3 1X18 (SUTURE) ×2 IMPLANT
SUT VIC AB 3-0 FS2 27 (SUTURE) IMPLANT
SYR CONTROL 10ML LL (SYRINGE) ×4 IMPLANT
UNDERPAD 30X30 (UNDERPADS AND DIAPERS) ×4 IMPLANT

## 2017-08-08 NOTE — H&P (Signed)
  Shannon Stewart is an 32 y.o. male.   Chief Complaint: Right thumb injury HPI: 32 year old male states he injured the right thumb while at work today when a drill bit went into it.  Seen in outside facility were rigorous or taken revealing a tuft fracture.  He was found to have a nailbed injury.  Was referred for further care.  He wishes to proceed with operative debridement and repair with possible pinning.  Allergies: No Known Allergies  History reviewed. No pertinent past medical history.  Past Surgical History:  Procedure Laterality Date  . NO PAST SURGERIES    . OTHER SURGICAL HISTORY      Family History: History reviewed. No pertinent family history.  Social History:   reports that he has been smoking.  He has been smoking about 0.50 packs per day. He has never used smokeless tobacco. He reports that he drinks alcohol. He reports that he does not use drugs.  Medications: Medications Prior to Admission  Medication Sig Dispense Refill  . clindamycin (CLEOCIN) 150 MG capsule Take 2 capsules (300 mg total) by mouth 3 (three) times daily. May dispense as  capsules 60 capsule 0  . ibuprofen (ADVIL,MOTRIN) 200 MG tablet Take 200 mg by mouth every 6 (six) hours as needed for mild pain or moderate pain.    . Lactobacillus (ACIDOPHILUS PROBIOTIC) 10 MG TABS Take 10 mg by mouth 3 (three) times daily. (Patient not taking: Reported on 12/07/2014) 30 tablet 0  . naproxen (NAPROSYN) 250 MG tablet Take 1 tablet (250 mg total) by mouth 2 (two) times daily with a meal. (Patient not taking: Reported on 12/07/2014) 30 tablet 0    No results found for this or any previous visit (from the past 48 hour(s)).  No results found.   A comprehensive review of systems was negative.  Blood pressure 129/68, pulse 65, temperature 98.2 F (36.8 C), temperature source Oral, resp. rate 18, height  (1.905 m), weight 72.6 kg (160 lb), SpO2 100 %.  General appearance: alert, cooperative and appears  stated age Head: Normocephalic, without obvious abnormality, atraumatic Neck: supple, symmetrical, trachea midline Cardio: regular rate and rhythm Resp: clear to auscultation bilaterally Extremities: Intact sensation and capillary refill all digits.  +epl/fpl/io.  He has a wound of the right thumb with nailbed avulsion from the fold.  He notes decreased sensation secondary to a digital block. Pulses: 2+ and symmetric Skin: Skin color, texture, turgor normal. No rashes or lesions Neurologic: Grossly normal Incision/Wound: As above  Assessment/Plan Right thumb injury with open distal phalanx fracture and nailbed injury.  I recommended operative irrigation and debridement with reduction and possible pinning and repair of skin and nailbed lacerations.Risks, benefits and alternatives of surgery were discussed including risks of blood loss, infection, damage to nerves/vessels/tendons/ligament/bone, failure of surgery, need for additional surgery, complication with wound healing, nonunion, malunion, stiffness.  He voiced understanding of these risks and elected to proceed.    Zaida Reiland R 08/08/2017, 2:21 PM

## 2017-08-08 NOTE — Anesthesia Preprocedure Evaluation (Addendum)
Anesthesia Evaluation  Patient identified by MRN, date of birth, ID band Patient awake    Reviewed: Allergy & Precautions, NPO status , Patient's Chart, lab work & pertinent test results  Airway Mallampati: II  TM Distance: >3 FB Neck ROM: Full    Dental  (+) Teeth Intact   Pulmonary Current Smoker,    Pulmonary exam normal breath sounds clear to auscultation       Cardiovascular negative cardio ROS Normal cardiovascular exam Rhythm:Regular Rate:Normal     Neuro/Psych negative neurological ROS  negative psych ROS   GI/Hepatic negative GI ROS, Neg liver ROS,   Endo/Other  negative endocrine ROS  Renal/GU negative Renal ROS  negative genitourinary   Musculoskeletal Right thumb injury   Abdominal   Peds  Hematology negative hematology ROS (+)   Anesthesia Other Findings   Reproductive/Obstetrics                             Anesthesia Physical Anesthesia Plan  ASA: II  Anesthesia Plan: MAC   Post-op Pain Management:    Induction: Intravenous  PONV Risk Score and Plan: 1 and Ondansetron, Propofol infusion and Treatment may vary due to age or medical condition  Airway Management Planned: Natural Airway, Nasal Cannula and Simple Face Mask  Additional Equipment:   Intra-op Plan:   Post-operative Plan:   Informed Consent: I have reviewed the patients History and Physical, chart, labs and discussed the procedure including the risks, benefits and alternatives for the proposed anesthesia with the patient or authorized representative who has indicated his/her understanding and acceptance.   Dental advisory given  Plan Discussed with: Anesthesiologist, CRNA and Surgeon  Anesthesia Plan Comments:        Anesthesia Quick Evaluation

## 2017-08-08 NOTE — Brief Op Note (Signed)
08/08/2017  4:11 PM  PATIENT:  Shannon Stewart  32 y.o. male  PRE-OPERATIVE DIAGNOSIS:  Right Thumb Injury  POST-OPERATIVE DIAGNOSIS:  Right Thumb Injury  PROCEDURE:  Procedure(s): INCISION AND DEBRIDEMENT  RIGHT THUMBWITH NAILBED AND SKIN REPAIR (Right)  SURGEON:  Surgeon(s) and Role:    Betha Loa, MD - Primary  PHYSICIAN ASSISTANT:   ASSISTANTS: none   ANESTHESIA:   local and sedation  EBL:  Minimal   BLOOD ADMINISTERED:none  DRAINS: none   LOCAL MEDICATIONS USED:  MARCAINE     SPECIMEN:  No Specimen  DISPOSITION OF SPECIMEN:  N/A  COUNTS:  YES  TOURNIQUET:   Total Tourniquet Time Documented: Upper Arm (Right) - 23 minutes Total: Upper Arm (Right) - 23 minutes   DICTATION: .Other Dictation: Dictation Number [id number]  PLAN OF CARE: Discharge to home after PACU  PATIENT DISPOSITION:  PACU - hemodynamically stable.

## 2017-08-08 NOTE — Transfer of Care (Signed)
Immediate Anesthesia Transfer of Care Note  Patient: Shannon Stewart  Procedure(s) Performed: INCISION AND DEBRIDEMENT  RIGHT THUMBWITH NAILBED AND SKIN REPAIR (Right Hand)  Patient Location: PACU  Anesthesia Type:MAC  Level of Consciousness: awake, alert  and oriented  Airway & Oxygen Therapy: Patient Spontanous Breathing and Patient connected to face mask oxygen  Post-op Assessment: Report given to RN and Post -op Vital signs reviewed and stable  Post vital signs: Reviewed and stable  Last Vitals:  Vitals Value Taken Time  BP    Temp    Pulse 63 08/08/2017  4:13 PM  Resp 16 08/08/2017  4:13 PM  SpO2 100 % 08/08/2017  4:13 PM  Vitals shown include unvalidated device data.  Last Pain:  Vitals:   08/08/17 1337  TempSrc: Oral         Complications: No apparent anesthesia complications

## 2017-08-08 NOTE — Discharge Instructions (Signed)

## 2017-08-08 NOTE — Anesthesia Postprocedure Evaluation (Signed)
Anesthesia Post Note  Patient: Shannon Stewart  Procedure(s) Performed: INCISION AND DEBRIDEMENT  RIGHT THUMB WITH NAILBED AND SKIN REPAIR (Right Hand)     Patient location during evaluation: PACU Anesthesia Type: MAC Level of consciousness: awake and alert and oriented Pain management: pain level controlled Vital Signs Assessment: post-procedure vital signs reviewed and stable Respiratory status: spontaneous breathing, nonlabored ventilation and respiratory function stable Cardiovascular status: stable and blood pressure returned to baseline Postop Assessment: no apparent nausea or vomiting Anesthetic complications: no    Last Vitals:  Vitals:   08/08/17 1613 08/08/17 1615  BP:  (!) 91/52  Pulse: 63 62  Resp: 16 16  Temp: 36.6 C   SpO2: 100% 100%    Last Pain:  Vitals:   08/08/17 1613  TempSrc:   PainSc: Asleep                 Sylvi Rybolt A.

## 2017-08-08 NOTE — Op Note (Signed)
[id number] 

## 2017-08-09 ENCOUNTER — Encounter (HOSPITAL_BASED_OUTPATIENT_CLINIC_OR_DEPARTMENT_OTHER): Payer: Self-pay | Admitting: Orthopedic Surgery

## 2017-08-09 NOTE — Op Note (Signed)
NAME: Shannon, Stewart MEDICAL RECORD [id number] ACCOUNT [id number] DATE OF BIRTH:04-09-1985 FACILITY: [facility] LOCATION: [facility] PHYSICIAN:Sheryl Towell Georges Lynch, MD  OPERATIVE REPORT  DATE OF PROCEDURE:  08/08/2017  PREOPERATIVE DIAGNOSIS:  Right thumb crush/drill injury with open distal phalanx fracture and nail bed injury.  POSTOPERATIVE DIAGNOSIS:  Right thumb crush/drill injury with open distal phalanx fracture and nail bed injury.  PROCEDURE:   1. Right thumb irrigation and debridement of open distal phalanx fracture including skin, subcutaneous tissues and tissue adjacent to bone.   2. Open reduction of right thumb open distal phalanx fracture 3. Right thumb repair of skin and nail bed laceration.  SURGEON:  Betha Loa, MD  ASSISTANT:  None.  ANESTHESIA:  Local with sedation.  INTRAVENOUS FLUIDS:  Per anesthesia flow sheet.  ESTIMATED BLOOD LOSS:  Minimal.  COMPLICATIONS:  None.  SPECIMENS:  None.  TOURNIQUET TIME:  23 minutes.  DISPOSITION:  Stable to PACU.  INDICATIONS:  The patient is a 32 year old male who states while at work this morning his right thumb was injured by a drilling machine.  He was seen at an outside facility where radiographs were taken revealing a tuft fracture with displacement.  He was  referred for further care.  I recommended irrigation and debridement with reduction and repair of skin and nail bed lacerations.  Risks, benefits and alternatives of surgery were discussed including the risk of blood loss, infection, damage to nerves,  vessels, tendons, ligaments, bone, further surgery, need for additional surgery, complications with wound healing, continued pain, nonunion, malunion, stiffness and nail deformity.  He voiced understanding of these risks.  He elected to proceed.  OPERATIVE COURSE:  After being identified preoperatively by myself, the patient agreed upon the procedure and site of procedure.  Surgical site was marked.  Risks, benefits  and alternatives of surgery were reviewed, and he wished to proceed.  Surgical  consent was signed.  He was given IV Ancef as preoperative antibiotic prophylaxis.  He was transferred to the operating room and placed on the operating table in supine position with the right upper extremity on an arm board.  General anesthesia was  induced by anesthesiologist.  Right upper extremity was prepped and draped in normal sterile orthopedic fashion.  A surgical pause was performed between surgeons, anesthesia, and operating room staff, and all were in agreement of the patient, procedure,  and site.  A tourniquet at the proximal aspect of the extremity was inflated to 250 mmHg after exsanguination of the limb with an Esmarch bandage.  The nail was removed with a Therapist, nutritional.  There was no gross contamination.  A contaminated hematoma was removed  from the laceration, which was transverse through the nail bed.  Scissors were used to sharply debride and remove any devitalized skin, including the dermis.  Once all contaminated hematoma was removed, the wound was copiously irrigated with sterile  saline.  A C-arm was used in AP and lateral projections to visualize the fracture fragment.  This was fragmented, and it was not felt that it required pin fixation.  The fracture was reduced under direct visualization.  A 5-0 Monocryl suture was used to  reapproximate the skin.  A 6-0 chromic suture was used to reapproximate the nail bed tissues.  Good reapproximation was obtained.  A piece of Xeroform was placed in the nail fold and the wound dressed with sterile Xeroform, 4 x 4's, and wrapped with a  Coban dressing lightly.  Alumafoam splint was placed and wrapped lightly with  a Coban dressing.  A digital block had been performed at the beginning of the procedure with 0.25% plain Marcaine to aid in operative and postoperative analgesia.  The  tourniquet was deflated at 23 minutes.  Fingertips were pink with brisk capillary  refill after deflation of tourniquet.  The operative drapes were broken down.  The patient was awoken from anesthesia safely.  He was transferred back to the stretcher and  taken to PACU in stable condition.  I will see him back in the office in 1 week for postoperative followup.  We will give him Norco 5/325 one to two p.o. q.6 hours p.r.n. pain, dispense #20, and Bactrim DS 1 p.o. b.i.d. x7 days.  LN/NUANCE  D:08/08/2017 T:08/09/2017 JOB:[id number]

## 2019-03-30 ENCOUNTER — Encounter (HOSPITAL_COMMUNITY): Payer: Self-pay | Admitting: Emergency Medicine

## 2019-03-30 ENCOUNTER — Other Ambulatory Visit: Payer: Self-pay

## 2019-03-30 ENCOUNTER — Emergency Department (HOSPITAL_COMMUNITY)
Admission: EM | Admit: 2019-03-30 | Discharge: 2019-03-31 | Disposition: A | Discharge status: Home / Self Care | Payer: Self-pay | Attending: Emergency Medicine | Admitting: Emergency Medicine

## 2019-03-30 DIAGNOSIS — Z5321 Procedure and treatment not carried out due to patient leaving prior to being seen by health care provider: Secondary | ICD-10-CM | POA: Insufficient documentation

## 2019-03-30 NOTE — ED Triage Notes (Signed)
Redness and swelling and drainage to both eyes started Tuesday. Denies exposure to pink eye

## 2019-03-30 NOTE — ED Notes (Signed)
Pt called for room x2 again with no response.

## 2019-03-30 NOTE — ED Notes (Signed)
Pt called for x1 with no response.

## 2019-03-30 NOTE — ED Notes (Signed)
Pt called in lobby, no answer.

## 2019-03-30 NOTE — ED Provider Notes (Signed)
Patient eloped from the emergency department before provider evaluation. I did not see or evaluate this patient. Triage note sates patient patient presenting with redness, swelling, drainage to bilateral eyes x4 days.    Cherre Robins, PA-C 03/30/19 1148    Julianne Rice, MD 03/30/19 1357

## 2019-03-30 NOTE — ED Notes (Signed)
Pt states that eyes were draining and started Tuesday. Pt then stated that eyes started to get swollen on Thursday. Pt stated he is having a difficult time seeing.

## 2019-12-27 ENCOUNTER — Ambulatory Visit (HOSPITAL_COMMUNITY)
Admission: EM | Admit: 2019-12-27 | Discharge: 2019-12-27 | Disposition: A | Discharge status: Home / Self Care | Payer: No Payment, Other | Attending: Psychiatry | Admitting: Psychiatry

## 2019-12-27 ENCOUNTER — Other Ambulatory Visit: Payer: Self-pay

## 2019-12-27 DIAGNOSIS — F339 Major depressive disorder, recurrent, unspecified: Secondary | ICD-10-CM | POA: Diagnosis not present

## 2019-12-27 DIAGNOSIS — F330 Major depressive disorder, recurrent, mild: Secondary | ICD-10-CM

## 2019-12-27 NOTE — Discharge Instructions (Addendum)
Physical activity as recommended. Diet as recommended. Patient is recommended to keep all psych outpatient follow-up appointments as scheduled.  Patient was provided a list of psych outpatient therapy center upon discharge.

## 2019-12-27 NOTE — ED Provider Notes (Signed)
Behavioral Health Urgent [facility] Medical Screening Exam  Patient Name: Shannon Stewart MRN: [id number] Date of Evaluation: 12/28/19 Chief Complaint:   Diagnosis:  Final diagnoses:  MDD (major depressive disorder), recurrent episode, mild (HCC)    History of Present illness: Shannon Stewart is a 34 y.o. male who presents voluntarily to [facility] with [employer]. Patient states that he has been feeling depressed lately and has not been able to cope with it. Patient states that he called the authorities because he felt overwhelmed. Patient states he came here for help. Patient denies suicide ideation and homicide ideation. Patient endorses auditory hallucinations stating that he hears a lot sounds and describes them as crowds of conversations. Patient states he drank a few beers today. Patient doesn't believe his intoxicated. Patient has only eaten once this morning and patient states he never sleeps because his mind is always racing. Patient states that his mother had severe mental health issues and passed away several years ago. Patient states he is homeless and unemployed. Patient states he has family issues with the mother of the children. Patient can contract for safety.  Psychiatric Specialty Exam  Presentation  General Appearance:Disheveled;Appropriate for Environment  Eye Contact:Fair  Speech:Garbled;Clear and Coherent  Speech Volume:Normal  Handedness:Right   Mood and Affect  Mood:Depressed;Anxious;Labile  Affect:Tearful;Depressed   Thought Process  Thought Processes:Goal Directed;Disorganized  Descriptions of Associations:Intact  Orientation:Full (Time, Place and Person)  Thought Content:Rumination;Scattered  Hallucinations:Auditory Patient states he hears a lot of sounds. When he is in a room, he will conversations  Ideas of Reference:None  Suicidal Thoughts:No  Homicidal Thoughts:Yes, Passive Without Intent;Without Plan;Without Means  to Carry Out   Sensorium  Memory:Immediate Fair;Recent Sandre Kitty  Judgment:Fair  Insight:Fair   Executive Functions  Concentration:Fair  Attention Span:Fair  Recall:Fair  Fund of Knowledge:Fair  Language:Fair   Psychomotor Activity  Psychomotor Activity:Normal   Assets  Assets:Desire for Improvement;Communication Skills   Sleep  Sleep:Poor  Number of hours: No data recorded  Physical Exam: Physical Exam Constitutional:      Appearance: Normal appearance.  HENT:     Head: Normocephalic and atraumatic.     Nose: Nose normal.  Eyes:     Extraocular Movements: Extraocular movements intact.     Pupils: Pupils are equal, round, and reactive to light.  Pulmonary:     Effort: Pulmonary effort is normal.     Breath sounds: Normal breath sounds.  Musculoskeletal:        General: Normal range of motion.     Cervical back: Normal range of motion and neck supple.  Skin:    General: Skin is warm and dry.  Neurological:     General: No focal deficit present.     Mental Status: He is alert. He is disoriented.  Psychiatric:     Comments: Patient appeared both anxious and depressed    Review of Systems  Constitutional: Negative.   HENT: Negative.   Eyes: Negative.   Respiratory: Negative.   Cardiovascular: Negative.   Gastrointestinal: Negative.   Musculoskeletal: Negative.   Skin: Negative.   Neurological: Negative.   Endo/Heme/Allergies: Negative.   Psychiatric/Behavioral: Positive for depression and hallucinations (Auditory). Negative for suicidal ideas. The patient is nervous/anxious and has insomnia.    Blood pressure (!) 124/92, pulse (!) 119, temperature (!) 96.8 F (36 C), temperature source Tympanic, resp. rate 20, height 6' (1.829 m), weight 165 lb (74.8 kg), SpO2 99 %. Body mass index is 22.38 kg/m.  Musculoskeletal: Strength &  Muscle Tone: within normal limits Gait & Station: normal Patient leans: N/A   [facility] MSE Discharge  Disposition for Follow up and Recommendations: Based on my evaluation the patient does not appear to have an emergency medical condition and can be discharged with resources and follow up care in outpatient services for Medication Management and Individual Therapy.   Meta Hatchet, PA 12/28/2019, 3:25 AM
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
## 2019-12-27 NOTE — ED Notes (Signed)
Patient belongings stored in locker number 25

## 2019-12-27 NOTE — ED Notes (Signed)
Pt was d/c home with outpatient resources information. Pt verbalized understanding. Pt was not accompanied by any family/friends.

## 2020-01-04 ENCOUNTER — Other Ambulatory Visit: Payer: Self-pay

## 2020-01-05 ENCOUNTER — Emergency Department (HOSPITAL_COMMUNITY)
Admission: EM | Admit: 2020-01-05 | Discharge: 2020-01-05 | Disposition: A | Discharge status: Home / Self Care | Payer: Self-pay | Attending: Emergency Medicine | Admitting: Emergency Medicine

## 2020-01-05 ENCOUNTER — Emergency Department (HOSPITAL_COMMUNITY): Admission: EM | Admit: 2020-01-05 | Discharge: 2020-01-05 | Discharge status: Absent without leave | Payer: Self-pay

## 2020-01-05 ENCOUNTER — Encounter (HOSPITAL_COMMUNITY): Payer: Self-pay | Admitting: Emergency Medicine

## 2020-01-05 ENCOUNTER — Other Ambulatory Visit: Payer: Self-pay

## 2020-01-05 DIAGNOSIS — Z5321 Procedure and treatment not carried out due to patient leaving prior to being seen by health care provider: Secondary | ICD-10-CM | POA: Insufficient documentation

## 2020-01-05 DIAGNOSIS — R519 Headache, unspecified: Secondary | ICD-10-CM | POA: Insufficient documentation

## 2020-01-05 DIAGNOSIS — R1310 Dysphagia, unspecified: Secondary | ICD-10-CM | POA: Insufficient documentation

## 2020-01-05 NOTE — ED Notes (Signed)
Patient not found in room at time when provider came to assess patient.
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
## 2022-08-10 ENCOUNTER — Ambulatory Visit: Payer: Self-pay | Admitting: Medical
